# Patient Record
Sex: Male | Born: 2000 | Race: Black or African American | Hispanic: No | Marital: Single | Smoking: Current every day smoker
Health system: Southern US, Community
[De-identification: ages and names within clinical notes are randomized; demographics above are authoritative.]

---
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
## 2018-05-29 IMAGING — MR MR KNEE*L* W/O CM
6 series · 39 of 40 positions shown · non-contrast
Comparison: None.

CLINICAL DATA: Football injury 2 weeks ago. Twisting injury with
popping sensation.

EXAM:
MRI OF THE LEFT KNEE WITHOUT CONTRAST
TECHNIQUE: Multiplanar, multisequence MR imaging of the knee was performed. No
intravenous contrast was administered.

[Series 3: PD fat-sat · axial · 3.0mm · 0.37mm/px · z∈[-75,+44]mm · 8 of 37 slices shown (1 of 4)]
[im 1/37]
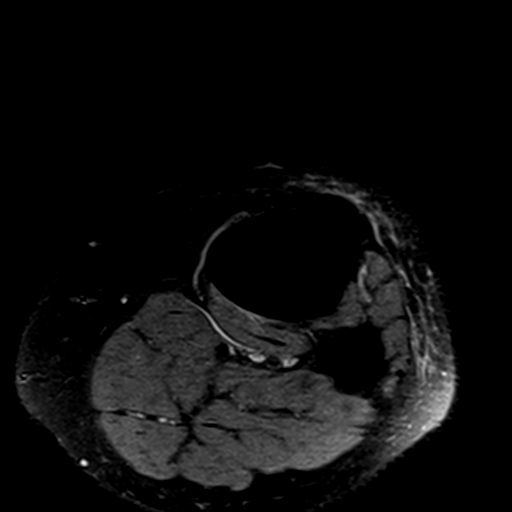
[im 6/37]
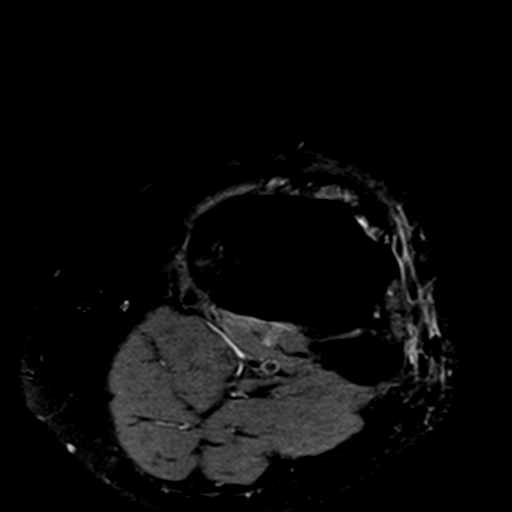
[im 11/37]
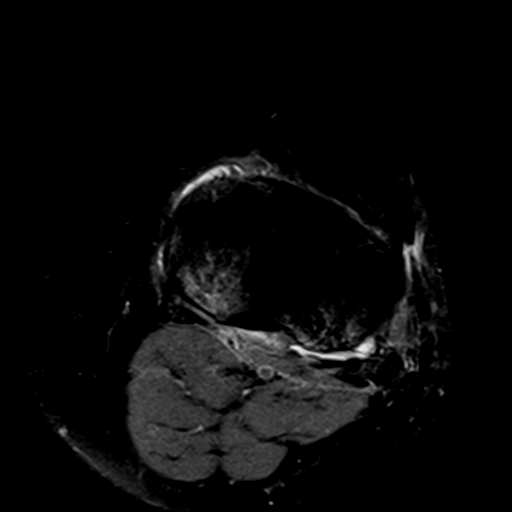
[im 16/37]
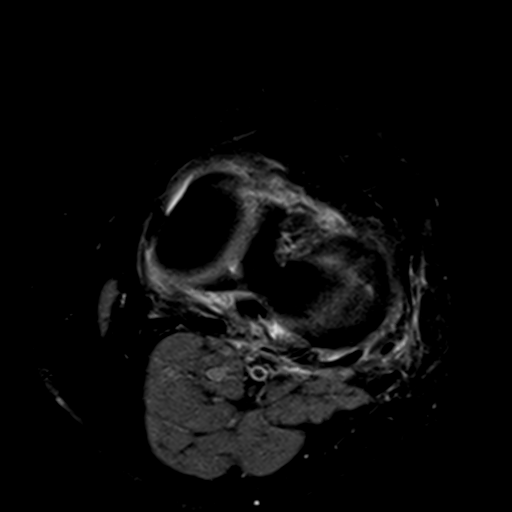
[im 21/37]
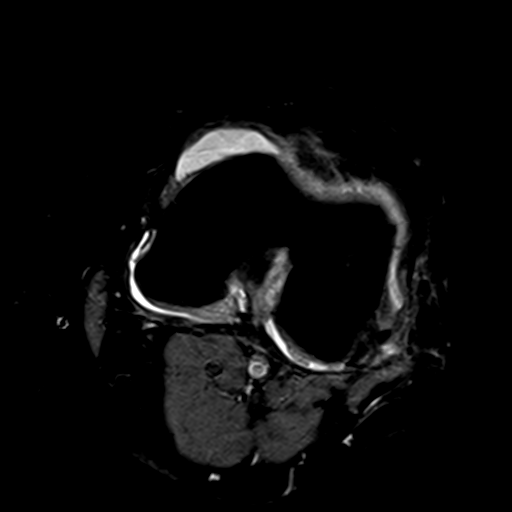
[im 26/37]
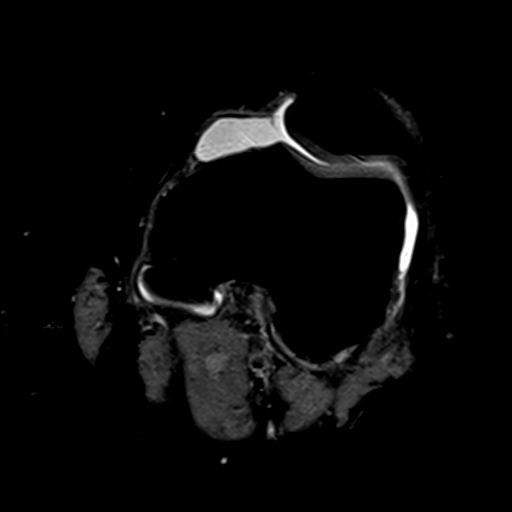
[im 31/37]
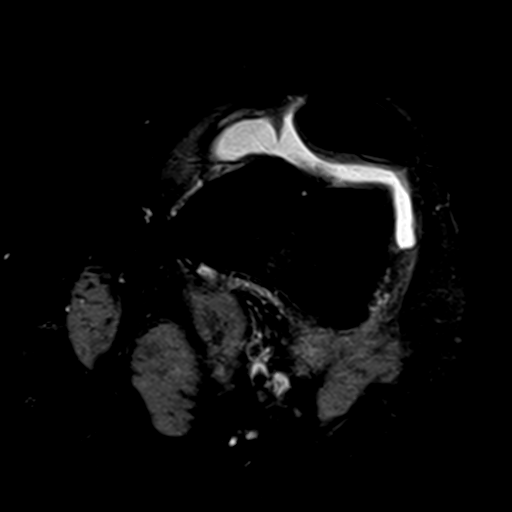
[im 37/37]
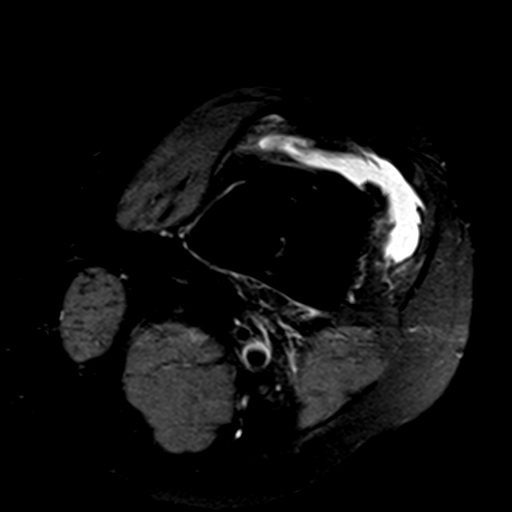

[Series 4: T1 · coronal · 3.0mm · 0.53mm/px · 6 of 39 slices shown]
[im 1/39]
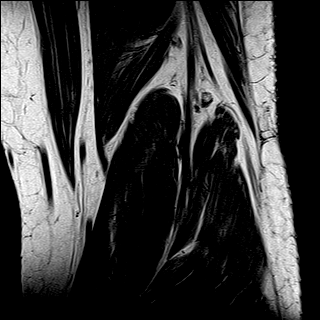
[im 7/39]
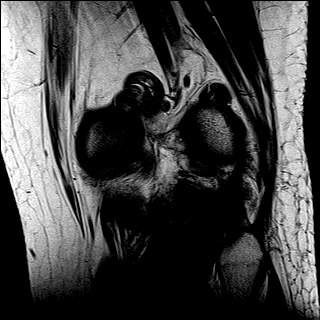
[im 13/39]
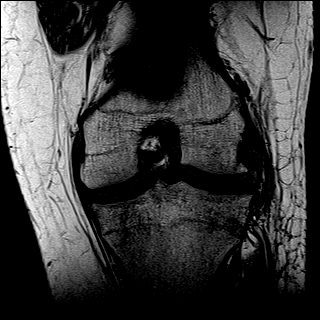
[im 20/39]
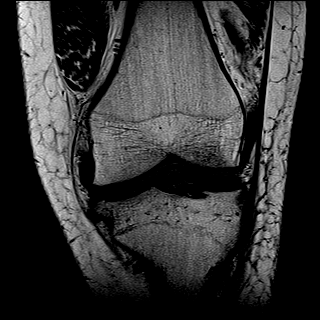
[im 26/39]
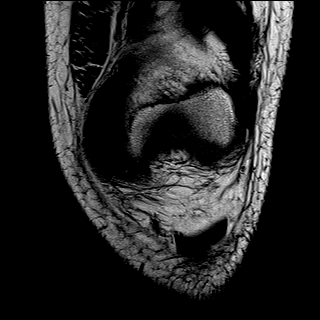
[im 32/39]
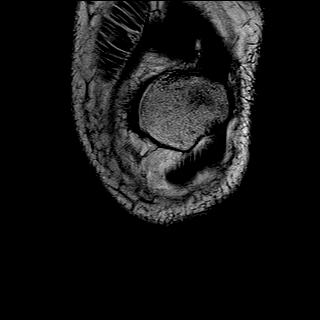

[Series 5: T2 fat-sat · coronal · 3.0mm · 0.53mm/px · 7 of 39 slices shown]
[im 1/39]
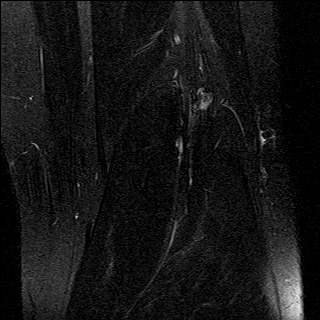
[im 7/39]
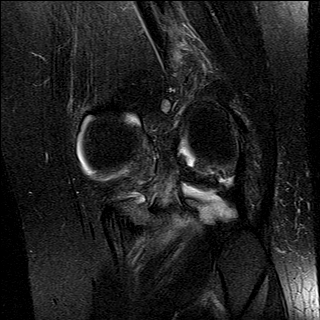
[im 13/39]
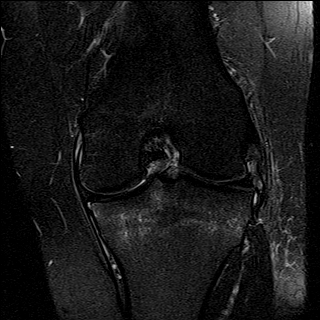
[im 20/39]
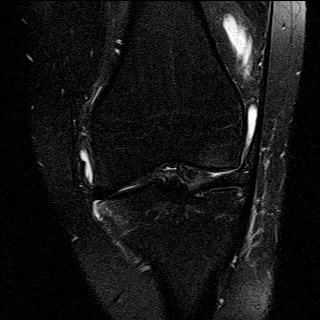
[im 26/39]
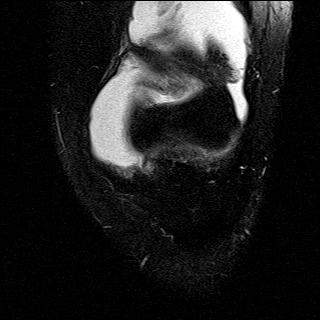
[im 32/39]
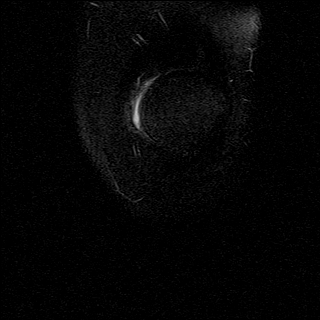
[im 39/39]
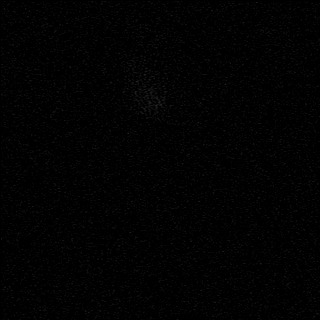

[Series 6: PD fat-sat · coronal · 3.0mm · 0.66mm/px · 7 of 39 slices shown (2 of 4)]
[im 1/39]
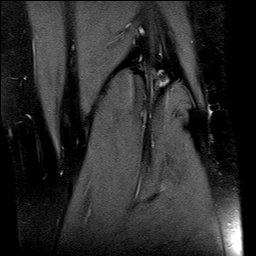
[im 7/39]
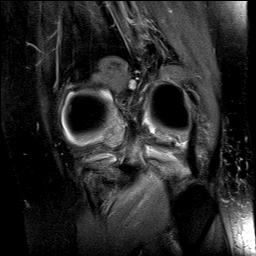
[im 13/39]
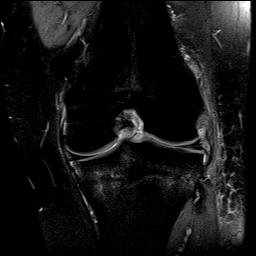
[im 20/39]
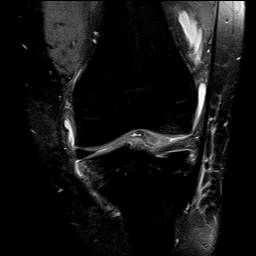
[im 26/39]
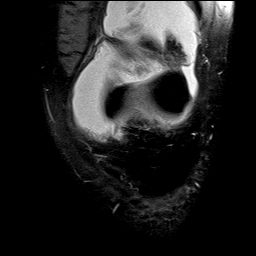
[im 32/39]
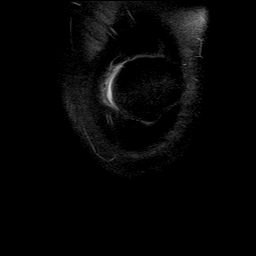
[im 39/39]
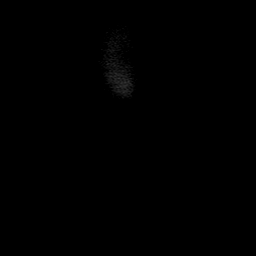

[Series 7: PD fat-sat · sagittal · 3.0mm · 0.70mm/px · 7 of 38 slices shown (3 of 4)]
[im 1/38]
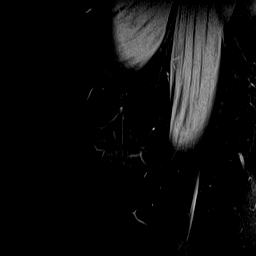
[im 7/38]
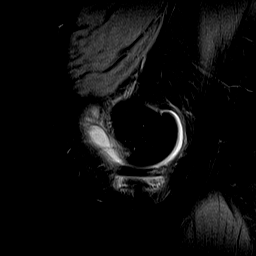
[im 13/38]
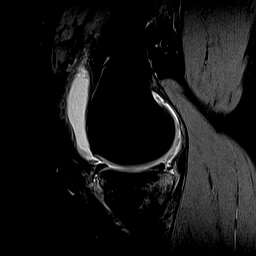
[im 19/38]
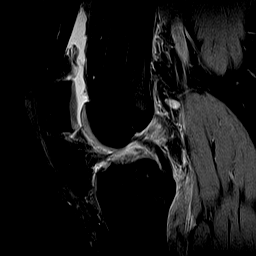
[im 25/38]
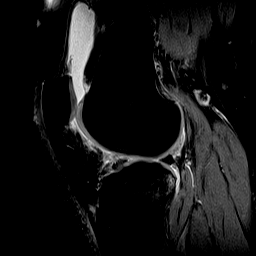
[im 31/38]
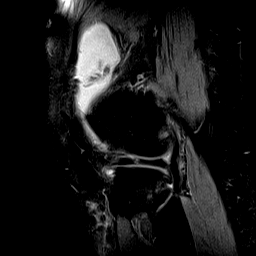
[im 38/38]
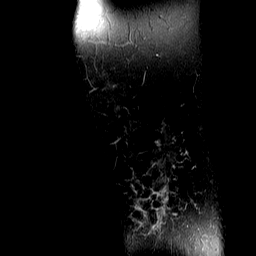

[Series 8: PD fat-sat · oblique · 2.0mm · 0.62mm/px · 4 of 21 slices shown (4 of 4)]
[im 1/21]
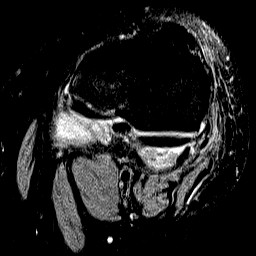
[im 7/21]
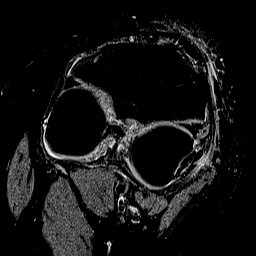
[im 14/21]
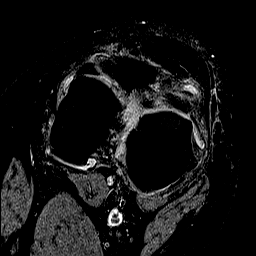
[im 21/21]
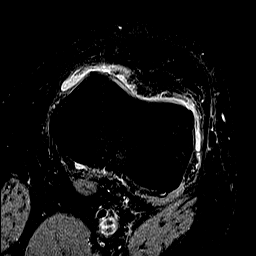

[39 of 40 positions shown; findings below may reference images not displayed]

FINDINGS: MENISCI

Medial meniscus: Vertical tear of the posterior periphery of the
posterior horn of the medial meniscus extending into the posterior
body.

Lateral meniscus: Complex tear of the anterior horn- body junction
of the lateral meniscus.

LIGAMENTS

Cruciates:  Complete ACL tear.  Intact PCL.

Collaterals: Medial collateral ligament is intact. Lateral
collateral ligament complex is intact.

CARTILAGE

Patellofemoral:  No chondral defect.

Medial:  No chondral defect.

Lateral: Mild partial-thickness cartilage loss of the medial aspect
of the lateral femoral condyle and lateral tibial plateau.

Joint: Large joint effusion. Mild edema in Hoffa's fat. No plical
thickening.

Popliteal Fossa:  No Baker's cyst.  Intact popliteus tendon.

Extensor Mechanism: Intact quadriceps tendon and patellar tendon.
Intact medial and lateral patellar retinaculum. Intact MPFL.

Bones: Marrow edema in the anterolateral femoral condyles and
posterolateral tibial plateau. Marrow edema in the posteromedial
tibial plateau.

Other: No fluid collection or hematoma.
IMPRESSION: 1. Complete ACL tear.
2. Vertical tear of the posterior periphery of the posterior horn of
the medial meniscus extending into the posterior body.
3. Complex tear of the anterior horn- body junction of the lateral
meniscus.
4. Large joint effusion.

## 2019-08-28 ENCOUNTER — Encounter: Payer: Self-pay | Admitting: Family Medicine

## 2019-08-28 ENCOUNTER — Other Ambulatory Visit: Payer: Self-pay

## 2019-08-28 ENCOUNTER — Ambulatory Visit: Payer: Self-pay | Admitting: Family Medicine

## 2019-08-28 DIAGNOSIS — Z5321 Procedure and treatment not carried out due to patient leaving prior to being seen by health care provider: Secondary | ICD-10-CM

## 2019-08-28 NOTE — Progress Notes (Signed)
I did not see pt. Please see RN documentation. 

## 2019-08-28 NOTE — Progress Notes (Signed)
Per client, needs to leave prior to provider exam as has Zoom interview at 1100 that he needs to be at home to participate in. Client rescheduled appt for 429/2021 at 9:00 am. Jossie Ng, RN

## 2019-08-30 ENCOUNTER — Ambulatory Visit: Payer: Medicaid Other

## 2019-08-31 ENCOUNTER — Ambulatory Visit: Payer: Medicaid Other
